# Patient Record
Sex: Male | Born: 1950 | Race: White | Hispanic: No | Marital: Single
Health system: Southern US, Community
[De-identification: ages and names within clinical notes are randomized; demographics above are authoritative.]

---

## 2008-02-19 ENCOUNTER — Ambulatory Visit: Payer: Self-pay | Admitting: Cardiology

## 2008-02-22 ENCOUNTER — Emergency Department: Payer: Self-pay | Admitting: Emergency Medicine

## 2008-11-18 ENCOUNTER — Ambulatory Visit: Payer: Self-pay | Admitting: Cardiology

## 2008-11-22 ENCOUNTER — Ambulatory Visit: Payer: Self-pay | Admitting: Family Medicine

## 2009-10-18 IMAGING — CR DG CHEST 1V PORT
1 series · 1 of 1 positions shown · non-contrast
Comparison: none

REASON FOR EXAM: Chest Pain
COMMENTS:

[view not recorded]
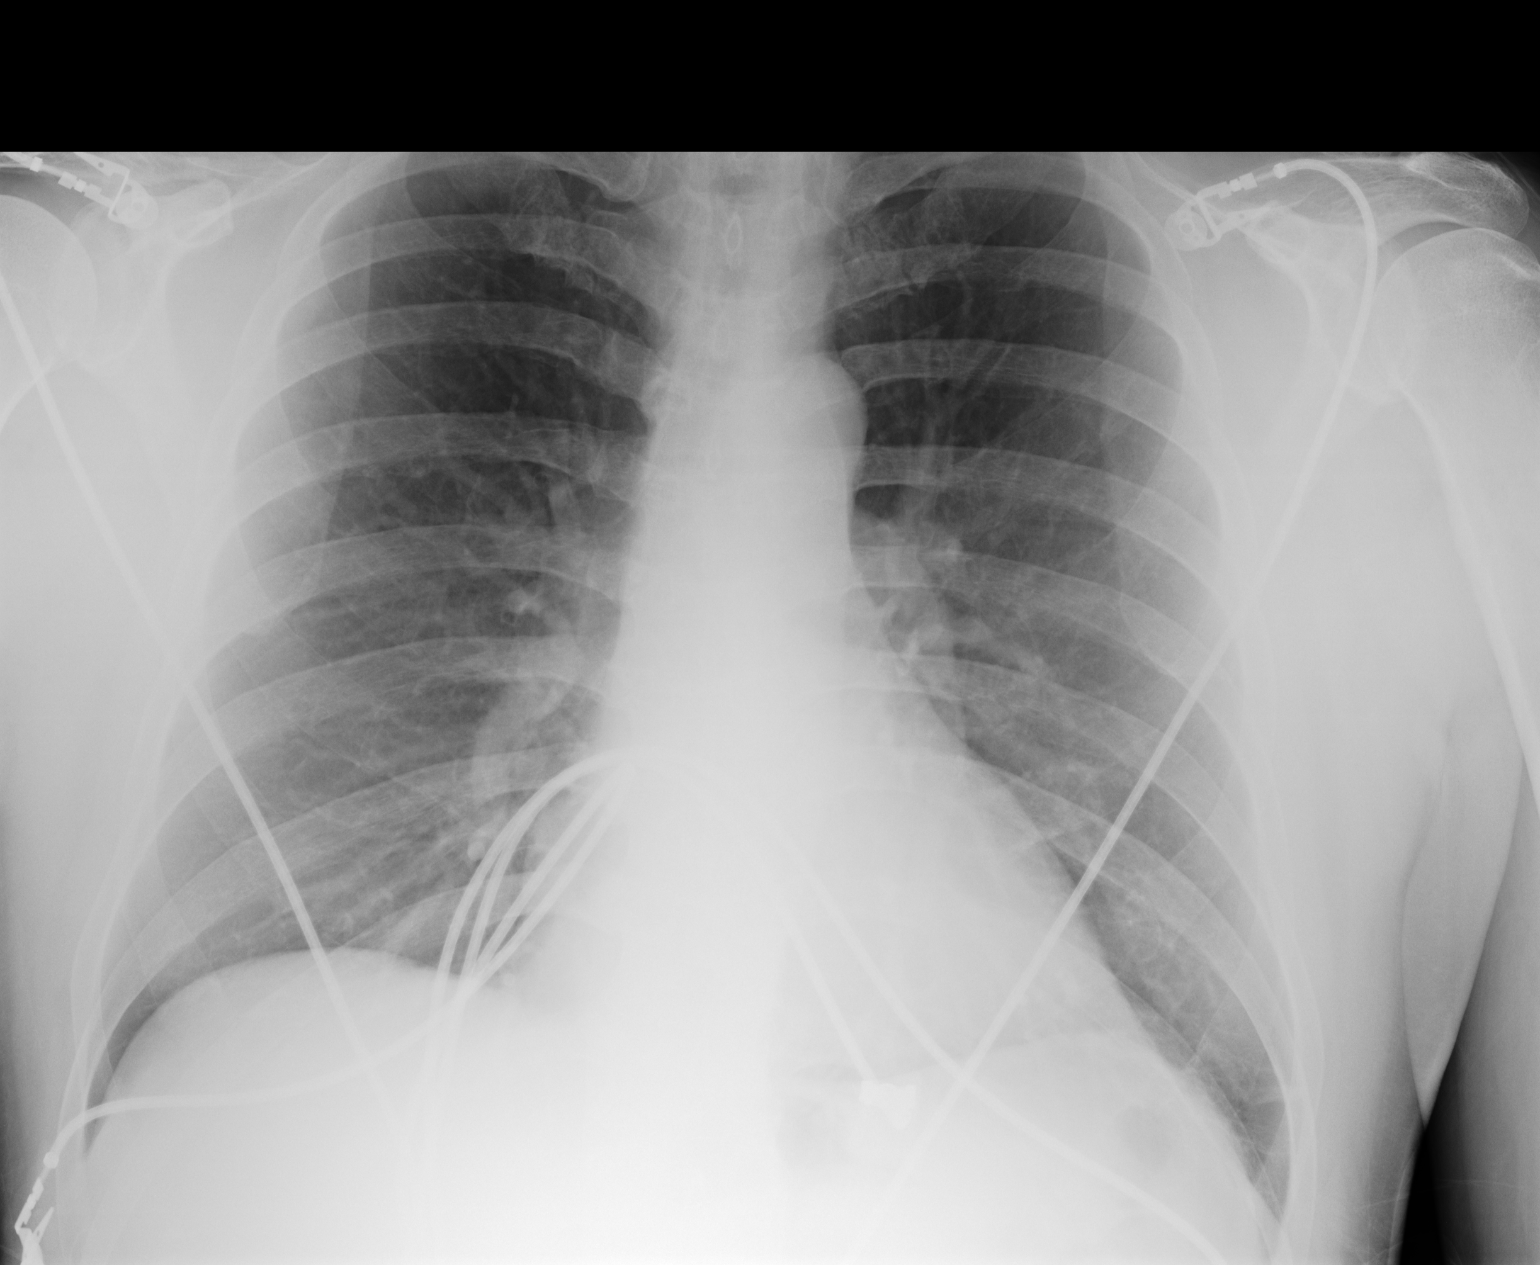

[1 of 1 positions shown; findings below may reference images not displayed]

PROCEDURE:     DXR - DXR PORTABLE CHEST SINGLE VIEW  - February 22, 2008  [DATE]

RESULT:     Cardiac monitoring electrodes are present. There is no previous
exam for comparison. The lungs are clear. The heart and pulmonary vessels
are normal. The bony and mediastinal structures are unremarkable. There is
no effusion. There is no pneumothorax or evidence of congestive failure.
IMPRESSION: No acute cardiopulmonary disease.

## 2013-04-13 ENCOUNTER — Ambulatory Visit: Payer: Self-pay | Admitting: Physician Assistant

## 2013-04-25 ENCOUNTER — Ambulatory Visit: Payer: Self-pay | Admitting: Family Medicine

## 2013-11-21 ENCOUNTER — Ambulatory Visit: Payer: Self-pay | Admitting: Cardiology

## 2014-03-26 ENCOUNTER — Ambulatory Visit: Payer: Self-pay | Admitting: Surgery

## 2014-03-26 LAB — BASIC METABOLIC PANEL
ANION GAP: 5 — AB (ref 7–16)
BUN: 13 mg/dL (ref 7–18)
CHLORIDE: 103 mmol/L (ref 98–107)
CREATININE: 1.17 mg/dL (ref 0.60–1.30)
Calcium, Total: 9.2 mg/dL (ref 8.5–10.1)
Co2: 31 mmol/L (ref 21–32)
EGFR (Non-African Amer.): >60
Glucose: 93 mg/dL (ref 65–99)
OSMOLALITY: 277 (ref 275–301)
Potassium: 4.8 mmol/L (ref 3.5–5.1)
Sodium: 139 mmol/L (ref 136–145)

## 2014-03-26 LAB — CBC WITH DIFFERENTIAL/PLATELET
Basophil #: 0.1 10*3/uL (ref 0.0–0.1)
Basophil %: 1 %
EOS PCT: 10 %
Eosinophil #: 0.8 10*3/uL — ABNORMAL HIGH (ref 0.0–0.7)
HCT: 45.3 % (ref 40.0–52.0)
HGB: 14.9 g/dL (ref 13.0–18.0)
Lymphocyte #: 2.3 10*3/uL (ref 1.0–3.6)
Lymphocyte %: 29.7 %
MCH: 31.9 pg (ref 26.0–34.0)
MCHC: 32.9 g/dL (ref 32.0–36.0)
MCV: 97 fL (ref 80–100)
Monocyte #: 0.6 x10 3/mm (ref 0.2–1.0)
Monocyte %: 7.7 %
NEUTROS ABS: 4 10*3/uL (ref 1.4–6.5)
NEUTROS PCT: 51.6 %
Platelet: 244 10*3/uL (ref 150–440)
RBC: 4.67 10*6/uL (ref 4.40–5.90)
RDW: 12.9 % (ref 11.5–14.5)
WBC: 7.8 10*3/uL (ref 3.8–10.6)

## 2014-04-02 ENCOUNTER — Ambulatory Visit: Payer: Self-pay | Admitting: Surgery

## 2014-07-01 LAB — SURGICAL PATHOLOGY

## 2014-07-07 NOTE — Op Note (Signed)
PATIENT NAME:  Jonathon Chen, Jonathon Chen MR#:  [id number] DATE OF BIRTH:  07-18-1950  DATE OF PROCEDURE:  04/02/2014  PREOPERATIVE DIAGNOSES:  1.  Umbilical hernia.  2.  Supraumbilical ventral hernia.   POSTOPERATIVE DIAGNOSES:  1.  Umbilical hernia.  2.  Supraumbilical ventral hernia.   PROCEDURE: Repair of umbilical and supraumbilical ventral hernias, primary closure.   SURGEON: Jomari Bartnik E. Excell Seltzerooper, MD.  ASSISTANT: Edgar FriskBree Johnson, PA-S   ANESTHESIA: General with LMA.   INDICATIONS: This is a patient with painful bulge and actually 2 hernias identified on physical examination. Preoperatively, we discussed rationale for surgery, the options of observation, risk of bleeding, infection, recurrence, mesh placement, mesh infection and cosmetic deformity. This was all reviewed for him in the preoperative holding area. He understood and agreed to proceed.   FINDINGS: Incarcerated preperitoneal fat in a supraumbilical ventral hernia and incarcerated omentum in an umbilical hernia. Each measured less than 5 mm and were approximately 2.5 to 3 cm removed from each other. The fascia appeared fairly strong and did not require mesh placement.   DESCRIPTION OF PROCEDURE: The patient was induced to general anesthesia. he was given IV antibiotics. VTE prophylaxis was in place. He was prepped and draped in a sterile fashion. Marcaine was infiltrated in skin and subcutaneous tissues around the periumbilical area. Then, a midline incision was utilized to access the subcutaneous space around the periumbilical and supraumbilical ventral hernias. Dissection down to fascia was performed. The umbilical hernia sac was opened and reduced of its incarcerated omentum and the fascial edges were cleaned and it was noted to be approximately 5 mm in size.   Attention was turned to the incarcerated preperitoneal fat in the supraumbilical hernia. This was excised utilizing electrocautery. No bleeding was noted. The hernial rent was less  than 5 mm as well and these were removed by approximately 3 cm from each other. Therefore, it was decided to utilize primary closure rather than mesh due to the size of the hernias and the remoteness of each rent.   Closure was performed on each one utilizing simple or figure-of-eight 0 Ethibonds and then additional Marcaine was placed for a total of 30 mL. Once assuring that hemostasis was adequate and sponge, laparotomy and needle counts were correct, the wound was closed by first approximating skin of the umbilicus back to fascia with 3-0 Vicryl, then deep sutures of 3-0 Vicryl were placed followed by 4-0 subcuticular Monocryl. Steri-Strips, Mastisol and sterile dressings were placed.  The patient tolerated the procedure well. There were no complications. He was taken to the recovery room in stable condition to be discharged in the care of his family. He will follow up in 10 days.    ____________________________ Adah Salvageichard E. Excell Seltzerooper, MD rec:TT D: 04/02/2014 09:18:17 ET T: 04/02/2014 14:14:30 ET JOB#: [id number]  cc: Adah Salvageichard E. Excell Seltzerooper, MD, <Dictator> Lattie HawICHARD E Lamarius Dirr MD ELECTRONICALLY SIGNED 04/02/2014 18:32

## 2014-07-07 NOTE — H&P (Signed)
PATIENT NAME:  Jonathon RippleWHITED, Henryk P MR#:  [id number] DATE OF BIRTH:  11-25-1950  DATE OF ADMISSION:  04/02/2014  CHIEF COMPLAINT: Umbilical hernia.   HISTORY OF PRESENT ILLNESS: This is a patient seen in the office multiple times. He has an umbilical hernia and a supraumbilical hernia and he is now present for elective surgery. He has minimal pain and bulge in the periumbilical and supraumbilical area and a workup has suggested 2 separate hernias in the area of the peri-umbilicus.    PAST MEDICAL HISTORY:,: Hypercholesterolemia, hyperlipidemia, coronary artery disease with stents in 2009.   PAST SURGICAL HISTORY: Tonsillectomy, adenoidectomy, angioplasty in 2009 with stent placement on aspirin and Plavix.   ALLERGIES: None.   MEDICATIONS: Multiple, see reconciliation.   FAMILY HISTORY: Noncontributory.   SOCIAL HISTORY: The patient does not smoke or drink. 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   REVIEW OF SYSTEMS: A complete system review has been performed and negative with the exception of that mentioned in the HPI.    PHYSICAL EXAMINATION:  GENERAL: Healthy male patient.  HEENT: No scleral icterus.  NECK: No palpable neck nodes.  CHEST: Clear to auscultation.  CARDIAC: Regular rate and rhythm.  ABDOMEN: Soft and nontender. There is a small umbilical and supraumbilical hernia separate and distinct from each other.  EXTREMITIES: Without edema.  NEUROLOGIC: Grossly intact.  INTEGUMENT: No jaundice.   LABORATORY VALUES: Demonstrate normal electrolytes and a H and H of 14.9 and 45.   ASSESSMENT AND PLAN: This is a patient with a symptomatic umbilical and supraumbilical hernia. The rationale for offering surgery to this patient has been discussed and the options of observation have been reviewed. The risks of bleeding, infection, mesh placement, mesh infection, recurrence, and cosmetic deformity have all been discussed. He understood and agreed to proceed.    ____________________________ Adah Salvageichard E. Excell Seltzerooper,  MD rec:bu D: 04/01/2014 13:05:43 ET T: 04/01/2014 13:33:31 ET JOB#: [id number]  cc: Adah Salvageichard E. Excell Seltzerooper, MD, <Dictator> Lattie HawICHARD E Tzippy Testerman MD ELECTRONICALLY SIGNED 04/01/2014 18:59

## 2015-01-09 ENCOUNTER — Telehealth: Payer: Self-pay | Admitting: Family Medicine

## 2015-01-09 NOTE — Telephone Encounter (Signed)
Pt came in to inquire about his Flight Physical paperwork. Paperwork was not up front. Please call pt ASAP in regards to this paperwork. Thanks.

## 2015-01-23 ENCOUNTER — Encounter (INDEPENDENT_AMBULATORY_CARE_PROVIDER_SITE_OTHER): Payer: Self-pay | Admitting: Family Medicine

## 2015-01-23 NOTE — Progress Notes (Signed)
[employer] flight physical

## 2015-06-18 IMAGING — NM CT OUTSIDE FILMS CHEST
8 series · 48 of 48 positions shown · non-contrast
Comparison: none

[Series 1000: rest_dc · 4.80mm/px · 6 of 68 frames shown]
[frame 6/68]
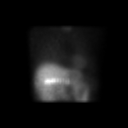
[frame 17/68]
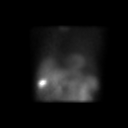
[frame 29/68]
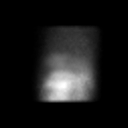
[frame 40/68]
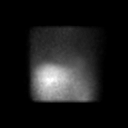
[frame 51/68]
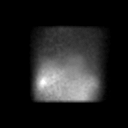
[frame 63/68]
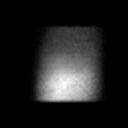

[Series 1000: stress (recon - noac ) · 4.8mm · 4.80mm/px · 6 of 37 frames shown]
[frame 4/37]
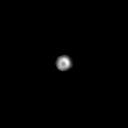
[frame 10/37]
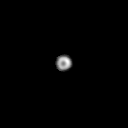
[frame 16/37]
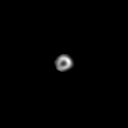
[frame 22/37]
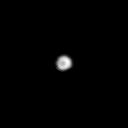
[frame 28/37]
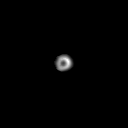
[frame 34/37]
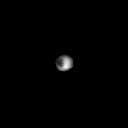

[Series 1000: rest (recon - noac ) · 4.8mm · 4.80mm/px · 6 of 36 frames shown]
[frame 4/36]
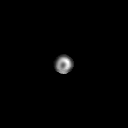
[frame 10/36]
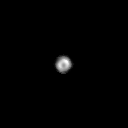
[frame 16/36]
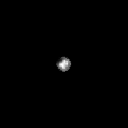
[frame 22/36]
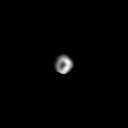
[frame 28/36]
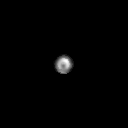
[frame 34/36]
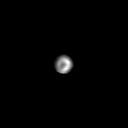

[Series 1000: rest (recon - ac ) · 4.8mm · 4.80mm/px · 6 of 33 frames shown]
[frame 3/33]
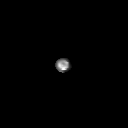
[frame 9/33]
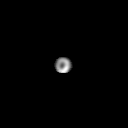
[frame 14/33]
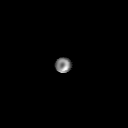
[frame 20/33]
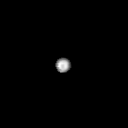
[frame 25/33]
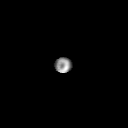
[frame 31/33]
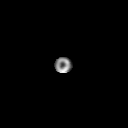

[Series 1000: stress-gated (recon) · 4.8mm · 4.80mm/px · 6 of 304 frames shown]
[frame 26/304]
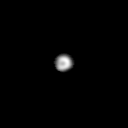
[frame 76/304]
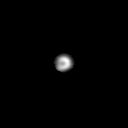
[frame 127/304]
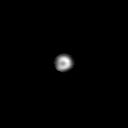
[frame 178/304]
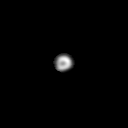
[frame 228/304]
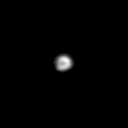
[frame 279/304]
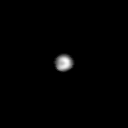

[Series 1000: stress (recon - ac ) · 4.8mm · 4.80mm/px · 6 of 34 frames shown]
[frame 3/34]
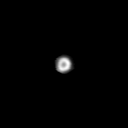
[frame 9/34]
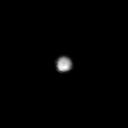
[frame 15/34]
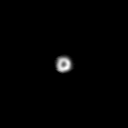
[frame 20/34]
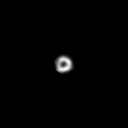
[frame 26/34]
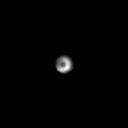
[frame 32/34]
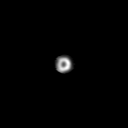

[Series 1000: stress_dc · 4.80mm/px · 6 of 68 frames shown]
[frame 6/68]
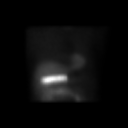
[frame 17/68]
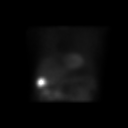
[frame 29/68]
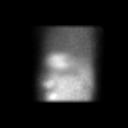
[frame 40/68]
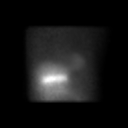
[frame 51/68]
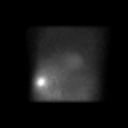
[frame 63/68]
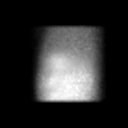

[Series 1000: stress-gated_dc · 4.80mm/px · 6 of 544 frames shown]
[frame 46/544]
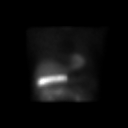
[frame 136/544]
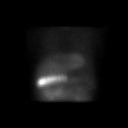
[frame 227/544]
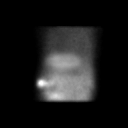
[frame 318/544]
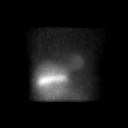
[frame 408/544]
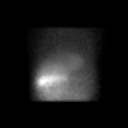
[frame 499/544]
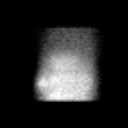

[48 of 48 positions shown; findings below may reference images not displayed]

**** An original report or order could not be provided from the [HOSPITAL] [facility] ****
